# Patient Record
Sex: Male | Born: 1990 | Hispanic: No | Marital: Single
Health system: Southern US, Community
[De-identification: ages and names within clinical notes are randomized; demographics above are authoritative.]

---
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
## 2011-02-24 LAB — POCT I-STAT 3, ART BLOOD GAS (G3+)
Acid-base deficit: 6 mmol/L — ABNORMAL HIGH (ref 0.0–2.0)
Bicarbonate: 18.4 meq/L — ABNORMAL LOW (ref 20.0–24.0)
Bicarbonate: 18.5 meq/L — ABNORMAL LOW (ref 20.0–24.0)
Bicarbonate: 21.9 meq/L (ref 20.0–24.0)
O2 Saturation: 99 %
Patient temperature: 100.5
pCO2 arterial: 28.8 mmHg — ABNORMAL LOW (ref 35.0–45.0)
pCO2 arterial: 29.8 mmHg — ABNORMAL LOW (ref 35.0–45.0)
pCO2 arterial: 41.3 mmHg (ref 35.0–45.0)
pCO2 arterial: 42.9 mmHg (ref 35.0–45.0)
pH, Arterial: 7.311 — ABNORMAL LOW (ref 7.350–7.450)
pH, Arterial: 7.322 — ABNORMAL LOW (ref 7.350–7.450)
pH, Arterial: 7.326 — ABNORMAL LOW (ref 7.350–7.450)
pH, Arterial: 7.398 (ref 7.350–7.450)
pH, Arterial: 7.418 (ref 7.350–7.450)
pO2, Arterial: 142 mmHg — ABNORMAL HIGH (ref 80.0–100.0)
pO2, Arterial: 146 mmHg — ABNORMAL HIGH (ref 80.0–100.0)
pO2, Arterial: 147 mmHg — ABNORMAL HIGH (ref 80.0–100.0)

## 2011-02-24 LAB — TYPE AND SCREEN
ABO/RH(D): A POS
Antibody Screen: NEGATIVE

## 2011-02-24 LAB — PREPARE FRESH FROZEN PLASMA

## 2011-02-24 LAB — CBC
HCT: 25.2 % — ABNORMAL LOW (ref 39.0–52.0)
HCT: 30.1 % — ABNORMAL LOW (ref 39.0–52.0)
HCT: 31.4 % — ABNORMAL LOW (ref 39.0–52.0)
HCT: 35 % — ABNORMAL LOW (ref 39.0–52.0)
Hemoglobin: 8.3 g/dL — ABNORMAL LOW (ref 13.0–17.0)
Hemoglobin: 8.7 g/dL — ABNORMAL LOW (ref 13.0–17.0)
Hemoglobin: 9.4 g/dL — ABNORMAL LOW (ref 13.0–17.0)
MCH: 29.8 pg (ref 26.0–34.0)
MCH: 30.6 pg (ref 26.0–34.0)
MCH: 30.6 pg (ref 26.0–34.0)
MCHC: 32.9 g/dL (ref 30.0–36.0)
MCHC: 33.4 g/dL (ref 30.0–36.0)
MCHC: 33.7 g/dL (ref 30.0–36.0)
MCHC: 33.9 g/dL (ref 30.0–36.0)
MCHC: 34.1 g/dL (ref 30.0–36.0)
MCHC: 34.1 g/dL (ref 30.0–36.0)
MCHC: 34.2 g/dL (ref 30.0–36.0)
MCV: 89 fL (ref 78.0–100.0)
MCV: 92.4 fL (ref 78.0–100.0)
Platelets: 132 10*3/uL — ABNORMAL LOW (ref 150–400)
Platelets: 301 10*3/uL (ref 150–400)
RBC: 3.02 MIL/uL — ABNORMAL LOW (ref 4.22–5.81)
RBC: 3.72 MIL/uL — ABNORMAL LOW (ref 4.22–5.81)
RDW: 14.8 % (ref 11.5–15.5)
RDW: 15 % (ref 11.5–15.5)
RDW: 15.1 % (ref 11.5–15.5)
RDW: 15.7 % — ABNORMAL HIGH (ref 11.5–15.5)
RDW: 15.7 % — ABNORMAL HIGH (ref 11.5–15.5)
RDW: 15.7 % — ABNORMAL HIGH (ref 11.5–15.5)
WBC: 10.8 10*3/uL — ABNORMAL HIGH (ref 4.0–10.5)
WBC: 6.9 10*3/uL (ref 4.0–10.5)
WBC: 7.5 10*3/uL (ref 4.0–10.5)
WBC: 7.6 10*3/uL (ref 4.0–10.5)
WBC: 8.1 10*3/uL (ref 4.0–10.5)
WBC: 8.4 10*3/uL (ref 4.0–10.5)
WBC: 8.5 10*3/uL (ref 4.0–10.5)

## 2011-02-24 LAB — BASIC METABOLIC PANEL
BUN: 11 mg/dL (ref 6–23)
BUN: 11 mg/dL (ref 6–23)
BUN: 14 mg/dL (ref 6–23)
BUN: 3 mg/dL — ABNORMAL LOW (ref 6–23)
BUN: 9 mg/dL (ref 6–23)
CO2: 26 mEq/L (ref 19–32)
Calcium: 7.2 mg/dL — ABNORMAL LOW (ref 8.4–10.5)
Calcium: 7.4 mg/dL — ABNORMAL LOW (ref 8.4–10.5)
Calcium: 8.1 mg/dL — ABNORMAL LOW (ref 8.4–10.5)
Calcium: 8.2 mg/dL — ABNORMAL LOW (ref 8.4–10.5)
Calcium: 8.3 mg/dL — ABNORMAL LOW (ref 8.4–10.5)
Creatinine, Ser: 0.69 mg/dL (ref 0.4–1.5)
GFR calc Af Amer: >60 mL/min (ref >60)
GFR calc Af Amer: >60 mL/min (ref >60)
GFR calc Af Amer: >60 mL/min (ref >60)
GFR calc non Af Amer: >60 mL/min (ref >60)
GFR calc non Af Amer: >60 mL/min (ref >60)
GFR calc non Af Amer: >60 mL/min (ref >60)
GFR calc non Af Amer: >60 mL/min (ref >60)
GFR calc non Af Amer: >60 mL/min (ref >60)
Glucose, Bld: 112 mg/dL — ABNORMAL HIGH (ref 70–99)
Glucose, Bld: 90 mg/dL (ref 70–99)
Glucose, Bld: 92 mg/dL (ref 70–99)
Glucose, Bld: 94 mg/dL (ref 70–99)
Glucose, Bld: 94 mg/dL (ref 70–99)
Potassium: 3.6 mEq/L (ref 3.5–5.1)
Potassium: 3.7 mEq/L (ref 3.5–5.1)
Potassium: 5.3 mEq/L — ABNORMAL HIGH (ref 3.5–5.1)
Sodium: 133 mEq/L — ABNORMAL LOW (ref 135–145)
Sodium: 134 mEq/L — ABNORMAL LOW (ref 135–145)
Sodium: 138 mEq/L (ref 135–145)

## 2011-02-24 LAB — POCT I-STAT 7, (LYTES, BLD GAS, ICA,H+H)
Acid-base deficit: 2 mmol/L (ref 0.0–2.0)
Acid-base deficit: 8 mmol/L — ABNORMAL HIGH (ref 0.0–2.0)
Bicarbonate: 23.6 meq/L (ref 20.0–24.0)
Calcium, Ion: 0.39 mmol/L — CL (ref 1.12–1.32)
HCT: 26 % — ABNORMAL LOW (ref 39.0–52.0)
Hemoglobin: 8.2 g/dL — ABNORMAL LOW (ref 13.0–17.0)
Patient temperature: 34.2
Potassium: 4.1 meq/L (ref 3.5–5.1)
Sodium: 142 meq/L (ref 135–145)
Sodium: 143 meq/L (ref 135–145)
pCO2 arterial: 34.8 mmHg — ABNORMAL LOW (ref 35.0–45.0)
pH, Arterial: 7.322 — ABNORMAL LOW (ref 7.350–7.450)
pO2, Arterial: 540 mmHg — ABNORMAL HIGH (ref 80.0–100.0)
pO2, Arterial: 592 mmHg — ABNORMAL HIGH (ref 80.0–100.0)

## 2011-02-24 LAB — CULTURE, BLOOD (ROUTINE X 2): Culture: NO GROWTH

## 2011-02-24 LAB — DIFFERENTIAL
Basophils Absolute: 0 10*3/uL (ref 0.0–0.1)
Basophils Absolute: 0 10*3/uL (ref 0.0–0.1)
Basophils Relative: 0 % (ref 0–1)
Basophils Relative: 0 % (ref 0–1)
Eosinophils Absolute: 0.3 10*3/uL (ref 0.0–0.7)
Lymphocytes Relative: 9 % — ABNORMAL LOW (ref 12–46)
Monocytes Absolute: 0.7 10*3/uL (ref 0.1–1.0)
Monocytes Relative: 7 % (ref 3–12)
Neutro Abs: 6.5 10*3/uL (ref 1.7–7.7)
Neutro Abs: 7.4 10*3/uL (ref 1.7–7.7)
Neutrophils Relative %: 86 % — ABNORMAL HIGH (ref 43–77)

## 2011-02-24 LAB — CULTURE, BAL-QUANTITATIVE W GRAM STAIN
Colony Count: NO GROWTH
Culture: NO GROWTH

## 2011-02-24 LAB — COMPREHENSIVE METABOLIC PANEL
Albumin: 3.6 g/dL (ref 3.5–5.2)
Calcium: 8.4 mg/dL (ref 8.4–10.5)
Creatinine, Ser: 1.05 mg/dL (ref 0.4–1.5)
Glucose, Bld: 173 mg/dL — ABNORMAL HIGH (ref 70–99)
Potassium: 2.8 mEq/L — ABNORMAL LOW (ref 3.5–5.1)
Sodium: 139 mEq/L (ref 135–145)
Total Bilirubin: 0.6 mg/dL (ref 0.3–1.2)
Total Protein: 6.7 g/dL (ref 6.0–8.3)

## 2011-02-24 LAB — URINE MICROSCOPIC-ADD ON

## 2011-02-24 LAB — URINALYSIS, ROUTINE W REFLEX MICROSCOPIC
Glucose, UA: NEGATIVE mg/dL
Leukocytes, UA: NEGATIVE
pH: 5.5 (ref 5.0–8.0)

## 2011-02-24 LAB — PROTIME-INR
INR: 1.21 (ref 0.00–1.49)
Prothrombin Time: 15.3 seconds — ABNORMAL HIGH (ref 11.6–15.2)

## 2011-02-24 LAB — APTT
aPTT: 26 seconds (ref 24–37)
aPTT: 28 seconds (ref 24–37)

## 2011-02-24 LAB — MRSA PCR SCREENING: MRSA by PCR: NEGATIVE

## 2011-02-24 LAB — LACTIC ACID, PLASMA: Lactic Acid, Venous: 4 mmol/L — ABNORMAL HIGH (ref 0.5–2.2)

## 2011-02-24 LAB — URINE CULTURE
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
## 2022-12-04 ENCOUNTER — Other Ambulatory Visit: Payer: Self-pay

## 2022-12-04 ENCOUNTER — Emergency Department (HOSPITAL_COMMUNITY)
Admission: EM | Admit: 2022-12-04 | Discharge: 2022-12-04 | Disposition: A | Discharge status: Home / Self Care | Payer: Self-pay | Attending: Emergency Medicine | Admitting: Emergency Medicine

## 2022-12-04 ENCOUNTER — Emergency Department (HOSPITAL_COMMUNITY): Payer: Self-pay

## 2022-12-04 DIAGNOSIS — N451 Epididymitis: Secondary | ICD-10-CM | POA: Insufficient documentation

## 2022-12-04 LAB — CBC WITH DIFFERENTIAL/PLATELET
Abs Immature Granulocytes: 0.03 10*3/uL (ref 0.00–0.07)
Basophils Absolute: 0 10*3/uL (ref 0.0–0.1)
Basophils Relative: 1 %
Eosinophils Absolute: 0.1 10*3/uL (ref 0.0–0.5)
Eosinophils Relative: 2 %
HCT: 36.8 % — ABNORMAL LOW (ref 39.0–52.0)
Hemoglobin: 11.9 g/dL — ABNORMAL LOW (ref 13.0–17.0)
Immature Granulocytes: 0 %
Lymphocytes Relative: 19 %
Lymphs Abs: 1.7 10*3/uL (ref 0.7–4.0)
MCH: 30.9 pg (ref 26.0–34.0)
MCHC: 32.3 g/dL (ref 30.0–36.0)
MCV: 95.6 fL (ref 80.0–100.0)
Monocytes Absolute: 0.6 10*3/uL (ref 0.1–1.0)
Monocytes Relative: 7 %
Neutro Abs: 6.3 10*3/uL (ref 1.7–7.7)
Neutrophils Relative %: 71 %
Platelets: 220 10*3/uL (ref 150–400)
RBC: 3.85 MIL/uL — ABNORMAL LOW (ref 4.22–5.81)
RDW: 13.2 % (ref 11.5–15.5)
WBC: 8.8 10*3/uL (ref 4.0–10.5)
nRBC: 0 % (ref 0.0–0.2)

## 2022-12-04 LAB — URINALYSIS, ROUTINE W REFLEX MICROSCOPIC
Bilirubin Urine: NEGATIVE
Glucose, UA: NEGATIVE mg/dL
Hgb urine dipstick: NEGATIVE
Ketones, ur: NEGATIVE mg/dL
Leukocytes,Ua: NEGATIVE
Nitrite: NEGATIVE
Protein, ur: NEGATIVE mg/dL
Specific Gravity, Urine: 1.02 (ref 1.005–1.030)
pH: 5 (ref 5.0–8.0)

## 2022-12-04 LAB — BASIC METABOLIC PANEL
Anion gap: 6 (ref 5–15)
BUN: 13 mg/dL (ref 6–20)
CO2: 23 mmol/L (ref 22–32)
Calcium: 9 mg/dL (ref 8.9–10.3)
Chloride: 110 mmol/L (ref 98–111)
Creatinine, Ser: 0.69 mg/dL (ref 0.61–1.24)
GFR, Estimated: >60 mL/min (ref >60)
Glucose, Bld: 90 mg/dL (ref 70–99)
Potassium: 3.9 mmol/L (ref 3.5–5.1)
Sodium: 139 mmol/L (ref 135–145)

## 2022-12-04 MED ORDER — LEVOFLOXACIN 500 MG PO TABS: 500.0000 mg | ORAL_TABLET | Freq: Every day | ORAL | 0 refills | Status: AC

## 2022-12-04 MED ORDER — IBUPROFEN 600 MG PO TABS: 600.0000 mg | ORAL_TABLET | Freq: Four times a day (QID) | ORAL | 0 refills | Status: AC | PRN

## 2022-12-04 MED ORDER — OXYCODONE-ACETAMINOPHEN 5-325 MG PO TABS: 1.0000 | ORAL_TABLET | Freq: Three times a day (TID) | ORAL | 0 refills | Status: AC | PRN

## 2022-12-04 MED ORDER — LEVOFLOXACIN 500 MG PO TABS: 500.0000 mg | ORAL_TABLET | Freq: Every day | ORAL | 0 refills | Status: DC

## 2022-12-04 NOTE — ED Provider Notes (Signed)
[facility] EMERGENCY DEPARTMENT Provider Note   CSN: [id number] Arrival date & time: 12/04/22  0016     History  Chief Complaint  Patient presents with   Left Testicular Pain     Dustin Montoya is a 31 y.o. male.  HPI Patient presents with left testicle pain that has had for the last few days.  No dysuria.  It is only on the left side.  No trauma.  Denies penile discharge.  States he is sexually active only with his same partner for the last 13 years.  Denies risk of STD.   No past medical history on file.  Home Medications Prior to Admission medications   Medication Sig Start Date End Date Taking? Authorizing Provider  ibuprofen (ADVIL) 600 MG tablet Take 1 tablet (600 mg total) by mouth every 6 (six) hours as needed. 12/04/22  Yes Benjiman Core, MD  oxyCODONE-acetaminophen (PERCOCET/ROXICET) 5-325 MG tablet Take 1-2 tablets by mouth every 8 (eight) hours as needed for severe pain. 12/04/22  Yes Benjiman Core, MD  levofloxacin (LEVAQUIN) 500 MG tablet Take 1 tablet (500 mg total) by mouth daily. 12/04/22   Benjiman Core, MD      Allergies    Patient has no known allergies.    Review of Systems   Review of Systems  Physical Exam Updated Vital Signs BP 134/82   Pulse 61   Temp 97.8 F (36.6 C) (Oral)   Resp 14   SpO2 98%  Physical Exam Vitals and nursing note reviewed.  HENT:     Head: Atraumatic.  Cardiovascular:     Rate and Rhythm: Regular rhythm.  Abdominal:     Tenderness: There is no abdominal tenderness.  Genitourinary:    Comments: Tenderness to left testicle.  Normal lie.  No right testicular tenderness.  No skin changes. Musculoskeletal:     Cervical back: Neck supple.  Skin:    General: Skin is warm.     Capillary Refill: Capillary refill takes less than 2 seconds.  Neurological:     Mental Status: He is alert.     ED Results / Procedures / Treatments   Labs (all labs ordered are listed, but only abnormal results  are displayed) Labs Reviewed  CBC WITH DIFFERENTIAL/PLATELET - Abnormal; Notable for the following components:      Result Value   RBC 3.85 (*)    Hemoglobin 11.9 (*)    HCT 36.8 (*)    All other components within normal limits  BASIC METABOLIC PANEL  URINALYSIS, ROUTINE W REFLEX MICROSCOPIC    EKG None  Radiology US SCROTUM W/DOPPLER  Result Date: 12/04/2022 CLINICAL DATA:  Left testicle pain with swelling since last week EXAM: SCROTAL ULTRASOUND DOPPLER ULTRASOUND OF THE TESTICLES TECHNIQUE: Complete ultrasound examination of the testicles, epididymis, and other scrotal structures was performed. Color and spectral Doppler ultrasound were also utilized to evaluate blood flow to the testicles. COMPARISON:  None Available. FINDINGS: Right testicle Measurements: 44 x 19 x 26 mm.  No mass or abnormal vascularity. Left testicle Measurements:  40 x 20 x 26 mm.  No mass or abnormal vascularity Right epididymis:  Normal size and vascularity.  Simple 3 mm cyst Left epididymis:  Enlarged and hypervascular Hydrocele: Moderate on the left with thin septations, no complexity to indicate pyocele. Varicocele:  Indeterminate in this setting. Pulsed Doppler interrogation of both testes demonstrates normal low resistance arterial and venous waveforms bilaterally. IMPRESSION: Left epididymitis and moderate hydrocele. Electronically Signed   By: Christiane Ha  Watts M.D.   On: 12/04/2022 05:53    Procedures Procedures    Medications Ordered in ED Medications - No data to display  ED Course/ Medical Decision Making/ A&P                           Medical Decision Making Amount and/or Complexity of Data Reviewed Labs: ordered.  Risk Prescription drug management.   Patient with left-sided testicular pain.  Initially the most worrisome thing on the differential diagnosis of the testicular torsion.  Ultrasound did not show it it did show epididymitis.  No fevers.  No penile discharge.  Urine does not show  infection.  Lab work reassuring.  Will treat for more enteric organisms since patient is low risk for STD.  Urology follow-up as needed.        Final Clinical Impression(s) / ED Diagnoses Final diagnoses:  Epididymitis    Rx / DC Orders ED Discharge Orders          Ordered    levofloxacin (LEVAQUIN) 500 MG tablet  Daily,   Status:  Discontinued        12/04/22 1044    levofloxacin (LEVAQUIN) 500 MG tablet  Daily        12/04/22 1046    oxyCODONE-acetaminophen (PERCOCET/ROXICET) 5-325 MG tablet  Every 8 hours PRN        12/04/22 1046    ibuprofen (ADVIL) 600 MG tablet  Every 6 hours PRN        12/04/22 1046              Benjiman Core, MD 12/04/22 1049

## 2022-12-04 NOTE — ED Notes (Signed)
Pt name called again for updated vitals, no response  

## 2022-12-04 NOTE — ED Triage Notes (Signed)
Patient reports left testicular pain with mild swelling onset last week , denies injury or dysuria .

## 2022-12-04 NOTE — ED Notes (Signed)
[place](EMT) called for pt a few times last night, pt did not respond.
